# Patient Record
Sex: Male | Born: 1965 | Race: White | Hispanic: No | Marital: Married | Smoking: Former smoker
Health system: Southern US, Community
[De-identification: ages and names within clinical notes are randomized; demographics above are authoritative.]

## PROBLEM LIST (undated history)

## (undated) DIAGNOSIS — Z8619 Personal history of other infectious and parasitic diseases: Secondary | ICD-10-CM

## (undated) HISTORY — DX: Personal history of other infectious and parasitic diseases: Z86.19

---
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
## 2017-01-18 ENCOUNTER — Telehealth: Payer: Self-pay

## 2017-01-18 NOTE — Telephone Encounter (Signed)
Okay per Dr. Drue NovelPaz to schedule NP appt. Appt scheduled 03/27/2017.

## 2017-03-27 ENCOUNTER — Ambulatory Visit: Payer: Self-pay | Admitting: Internal Medicine

## 2017-08-08 ENCOUNTER — Ambulatory Visit (HOSPITAL_BASED_OUTPATIENT_CLINIC_OR_DEPARTMENT_OTHER)
Admission: RE | Admit: 2017-08-08 | Discharge: 2017-08-08 | Disposition: A | Discharge status: Home / Self Care | Payer: BLUE CROSS/BLUE SHIELD | Source: Ambulatory Visit | Attending: Internal Medicine | Admitting: Internal Medicine

## 2017-08-08 ENCOUNTER — Ambulatory Visit: Payer: BLUE CROSS/BLUE SHIELD | Admitting: Internal Medicine

## 2017-08-08 ENCOUNTER — Encounter: Payer: Self-pay | Admitting: Internal Medicine

## 2017-08-08 VITALS — BP 142/84 | HR 91 | Temp 98.2°F | Resp 14 | Ht 74.0 in | Wt 255.5 lb

## 2017-08-08 DIAGNOSIS — R079 Chest pain, unspecified: Secondary | ICD-10-CM | POA: Diagnosis not present

## 2017-08-08 NOTE — Progress Notes (Signed)
Subjective:    Patient ID: Justin Dixon, male    DOB: December 05, 1965, 51 y.o.   MRN: [id number]  DOS:  08/08/2017 Type of visit - description : acute, new pt Interval history: Symptoms that started a week ago with pain at the right pectoral area, area is TTP, pain increases if he coughs. No much change w/ movements or differrent positions. Denies any injury, although few months ago started a program of doing several push-ups throughout the day.   Review of Systems Denies fever chills No difficulty breathing, URI type of symptoms or persistent cough No rash. Pain is not worse by leaning forward.  Past Medical History:  Diagnosis Date  . History of chicken pox     Past Surgical History:  Procedure Laterality Date  . TONSILLECTOMY AND ADENOIDECTOMY  1978    Social History   Socioeconomic History  . Marital status: Married    Spouse name: Archie Pattenonya   . Number of children: 3  . Years of education: Not on file  . Highest education level: Not on file  Social Needs  . Financial resource strain: Not on file  . Food insecurity - worry: Not on file  . Food insecurity - inability: Not on file  . Transportation needs - medical: Not on file  . Transportation needs - non-medical: Not on file  Occupational History  . 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 Smoking status: Former Games developermoker  . Smokeless tobacco: Never Used  . Tobacco comment: age [age], short-term  Substance and Sexual Activity  . Alcohol use: Yes    Comment: social  . Drug use: No  . Sexual activity: Not on file  Other Topics Concern  . Not on file  Social History Narrative  . Not on file     Family History  Problem Relation Age of Onset  . Stroke Mother 1673  . Stroke Maternal Grandmother   . CAD Neg Hx   . Diabetes Neg Hx   . Colon cancer Neg Hx   . Prostate cancer Neg Hx      Allergies as of 08/08/2017   No Known Allergies     Medication List    as of 08/08/2017 11:59 PM   You  have not been prescribed any medications.        Objective:   Physical Exam  Musculoskeletal:       Arms:  BP (!) 142/84 (BP Location: Left Arm, Patient Position: Sitting, Cuff Size: Small)   Pulse 91   Temp 98.2 F (36.8 C) (Oral)   Resp 14   Ht 6\' 2"  (1.88 m)   Wt 255 lb 8 oz (115.9 kg)   SpO2 99%   BMI 32.80 kg/m  General:   Well developed, well nourished . NAD.  HEENT:  Normocephalic . Face symmetric, atraumatic Neck:  No LAD or mass Lungs:  CTA B Normal respiratory effort, no intercostal retractions, no accessory muscle use. Heart: RRR,  no murmur.  no pretibial edema bilaterally  Shoulders: Normal range of motion. Abdomen:  Not distended, soft, non-tender. No rebound or rigidity.   Skin: Not pale. Not jaundice Neurologic:  alert & oriented X3.  Speech normal, gait appropriate for age and unassisted Psych--  Cognition and judgment appear intact.  Cooperative with normal attention span and concentration.  Behavior appropriate. No anxious or depressed appearing.     Assessment & Plan:    Assessment Healthy  PLAN:  Chest wall pain: as described  above, no h/o injury but he does push-ups multiple times a day.  Related?. I do not suspect pericarditis based on symptoms.  Likely a MSK sx.  Plan: Chest x-ray; completeness we will also get a CBC, BMP.  Tylenol or ibuprofen as needed.  GI precautions discussed, call if not better. Also, recommend CPX at his convenience

## 2017-08-08 NOTE — Progress Notes (Signed)
Pre visit review using our clinic review tool, if applicable. No additional management support is needed unless otherwise documented below in the visit note. 

## 2017-08-08 NOTE — Patient Instructions (Signed)
GO TO THE LAB : Get the blood work     GO TO THE FRONT DESK Schedule your next appointment for a  Physical exam at your convenience   STOP BY THE FIRST FLOOR:  get the XR   IBUPROFEN (Advil or Motrin) 200 mg 2 tablets every 6 hours as needed for pain.  Always take it with food because may cause gastritis and ulcers.  If you notice nausea, stomach pain, change in the color of stools --->  Stop the medicine and let us know  Tylenol  500 mg OTC 2 tabs a day every 8 hours as needed for pain   Check the  blood pressure   monthly   Be sure your blood pressure is between 110/65 and  145/85.  if it is consistently higher or lower, let me know

## 2017-08-09 LAB — BASIC METABOLIC PANEL
BUN: 16 mg/dL (ref 6–23)
CHLORIDE: 106 meq/L (ref 96–112)
CO2: 27 mEq/L (ref 19–32)
Calcium: 9 mg/dL (ref 8.4–10.5)
Creatinine, Ser: 0.96 mg/dL (ref 0.40–1.50)
GFR: 87.91 mL/min (ref >60.00)
Glucose, Bld: 94 mg/dL (ref 70–99)
POTASSIUM: 5 meq/L (ref 3.5–5.1)
SODIUM: 139 meq/L (ref 135–145)

## 2017-08-09 LAB — CBC WITH DIFFERENTIAL/PLATELET
BASOS ABS: 0.1 10*3/uL (ref 0.0–0.1)
Basophils Relative: 1.3 % (ref 0.0–3.0)
EOS ABS: 0.2 10*3/uL (ref 0.0–0.7)
Eosinophils Relative: 2.3 % (ref 0.0–5.0)
HCT: 46.8 % (ref 39.0–52.0)
Hemoglobin: 15.8 g/dL (ref 13.0–17.0)
LYMPHS ABS: 2.3 10*3/uL (ref 0.7–4.0)
Lymphocytes Relative: 24.7 % (ref 12.0–46.0)
MCHC: 33.7 g/dL (ref 30.0–36.0)
MCV: 92.7 fl (ref 78.0–100.0)
MONO ABS: 0.5 10*3/uL (ref 0.1–1.0)
MONOS PCT: 5.2 % (ref 3.0–12.0)
NEUTROS PCT: 66.5 % (ref 43.0–77.0)
Neutro Abs: 6.2 10*3/uL (ref 1.4–7.7)
Platelets: 256 10*3/uL (ref 150.0–400.0)
RBC: 5.04 Mil/uL (ref 4.22–5.81)
RDW: 13.2 % (ref 11.5–15.5)
WBC: 9.4 10*3/uL (ref 4.0–10.5)

## 2017-09-06 DIAGNOSIS — R030 Elevated blood-pressure reading, without diagnosis of hypertension: Secondary | ICD-10-CM | POA: Diagnosis not present

## 2017-09-06 DIAGNOSIS — J0140 Acute pansinusitis, unspecified: Secondary | ICD-10-CM | POA: Diagnosis not present

## 2018-08-06 IMAGING — DX DG CHEST 2V
2 series · 2 of 2 positions shown · non-contrast
Comparison: No prior.

CLINICAL DATA: Chest pain.

EXAM:
CHEST  2 VIEW

[chest pa]
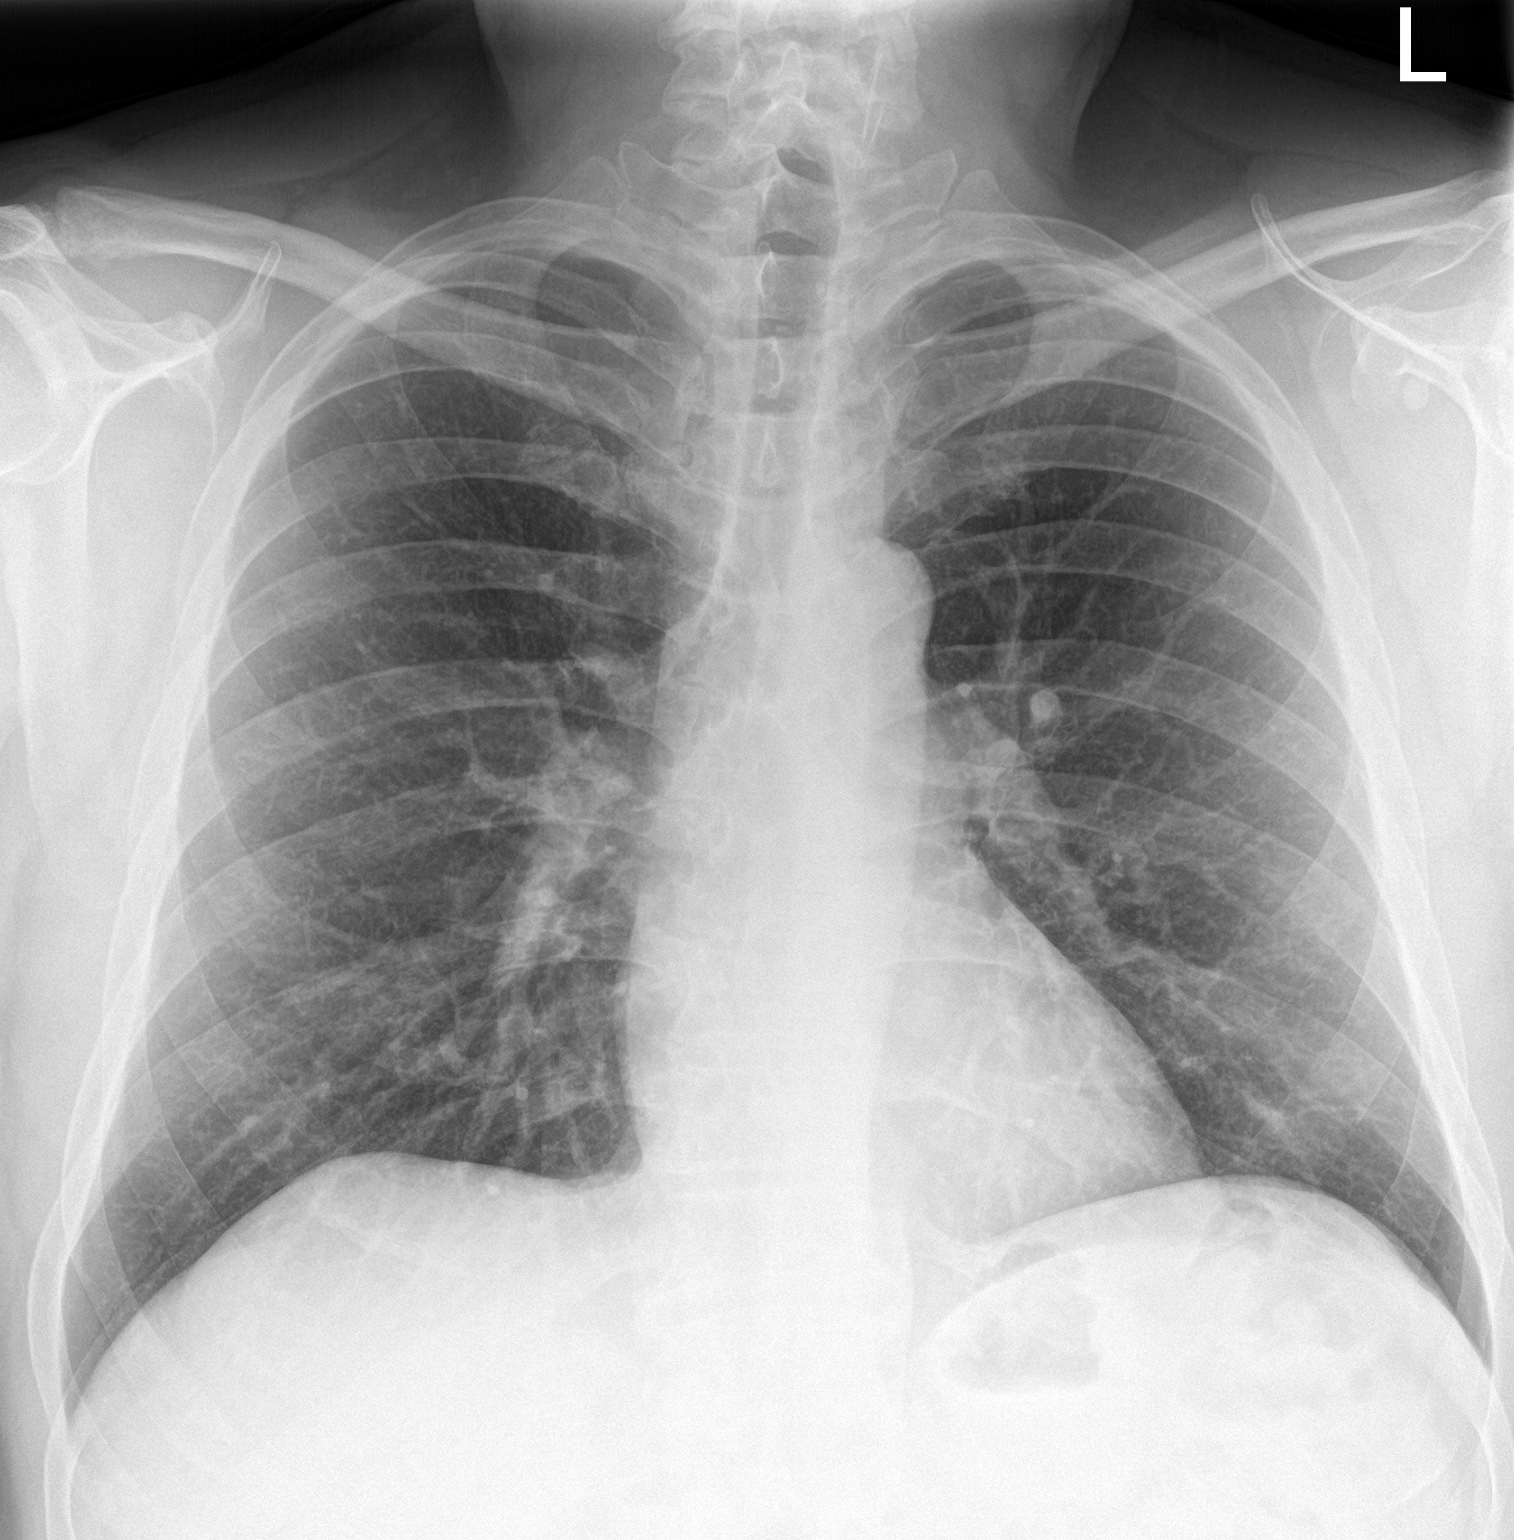

[chest lat]
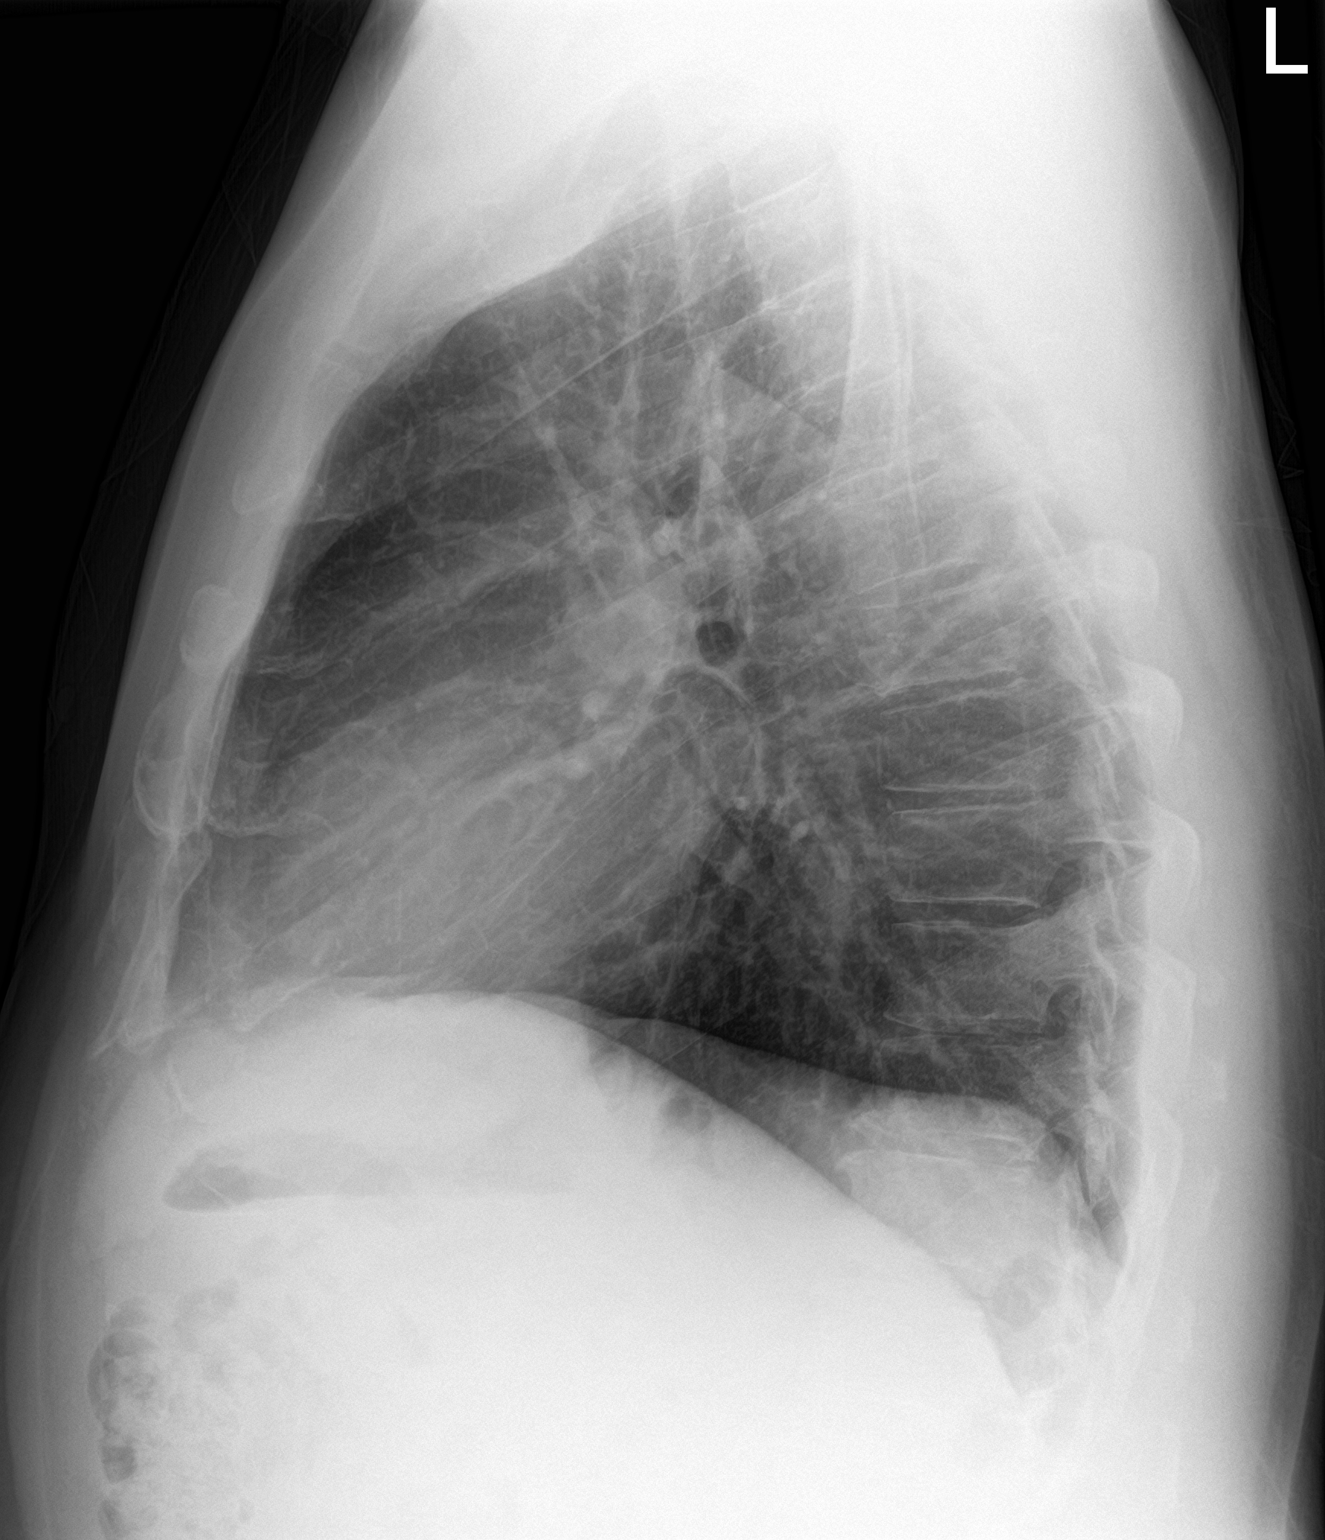

[2 of 2 positions shown; findings below may reference images not displayed]

FINDINGS: Mediastinum hilar structures normal. Heart size normal. No focal
infiltrate. No pleural effusion or pneumothorax.
IMPRESSION: No acute cardiopulmonary disease.

## 2019-01-17 DIAGNOSIS — Z20828 Contact with and (suspected) exposure to other viral communicable diseases: Secondary | ICD-10-CM | POA: Diagnosis not present

## 2019-06-23 ENCOUNTER — Telehealth: Payer: Self-pay

## 2019-06-23 NOTE — Telephone Encounter (Signed)
Noted, very sad

## 2019-06-23 NOTE — Telephone Encounter (Signed)
Pt passed away unexpectedly 07-18-2019. Autopsy to be performed by Medical Examiner on Tuesday morning, June 24, 2019.

## 2019-07-15 DEATH — deceased
